# Patient Record
Sex: Male | Born: 1950 | Race: White | Hispanic: No | Marital: Married
Health system: Southern US, Community
[De-identification: ages and names within clinical notes are randomized; demographics above are authoritative.]

## PROBLEM LIST (undated history)

## (undated) DIAGNOSIS — G20 Parkinson's disease: Secondary | ICD-10-CM

## (undated) DIAGNOSIS — E119 Type 2 diabetes mellitus without complications: Secondary | ICD-10-CM

---

## 2020-10-31 ENCOUNTER — Emergency Department (HOSPITAL_COMMUNITY): Payer: Medicare Other

## 2020-10-31 ENCOUNTER — Emergency Department (HOSPITAL_COMMUNITY)
Admission: EM | Admit: 2020-10-31 | Discharge: 2020-10-31 | Disposition: A | Discharge status: Home / Self Care | Payer: Medicare Other | Attending: Emergency Medicine | Admitting: Emergency Medicine

## 2020-10-31 ENCOUNTER — Encounter (HOSPITAL_COMMUNITY): Payer: Self-pay

## 2020-10-31 ENCOUNTER — Other Ambulatory Visit (HOSPITAL_COMMUNITY): Payer: Medicare Other

## 2020-10-31 DIAGNOSIS — Y9301 Activity, walking, marching and hiking: Secondary | ICD-10-CM | POA: Diagnosis not present

## 2020-10-31 DIAGNOSIS — S065X9A Traumatic subdural hemorrhage with loss of consciousness of unspecified duration, initial encounter: Secondary | ICD-10-CM

## 2020-10-31 DIAGNOSIS — Z7901 Long term (current) use of anticoagulants: Secondary | ICD-10-CM | POA: Insufficient documentation

## 2020-10-31 DIAGNOSIS — G44319 Acute post-traumatic headache, not intractable: Secondary | ICD-10-CM | POA: Diagnosis not present

## 2020-10-31 DIAGNOSIS — M25511 Pain in right shoulder: Secondary | ICD-10-CM | POA: Insufficient documentation

## 2020-10-31 DIAGNOSIS — S065X0A Traumatic subdural hemorrhage without loss of consciousness, initial encounter: Secondary | ICD-10-CM | POA: Diagnosis not present

## 2020-10-31 DIAGNOSIS — M25552 Pain in left hip: Secondary | ICD-10-CM | POA: Diagnosis not present

## 2020-10-31 DIAGNOSIS — W19XXXA Unspecified fall, initial encounter: Secondary | ICD-10-CM

## 2020-10-31 DIAGNOSIS — M545 Low back pain, unspecified: Secondary | ICD-10-CM | POA: Insufficient documentation

## 2020-10-31 DIAGNOSIS — W108XXA Fall (on) (from) other stairs and steps, initial encounter: Secondary | ICD-10-CM | POA: Diagnosis not present

## 2020-10-31 DIAGNOSIS — M25512 Pain in left shoulder: Secondary | ICD-10-CM | POA: Diagnosis not present

## 2020-10-31 DIAGNOSIS — M79652 Pain in left thigh: Secondary | ICD-10-CM | POA: Insufficient documentation

## 2020-10-31 DIAGNOSIS — S0992XA Unspecified injury of nose, initial encounter: Secondary | ICD-10-CM | POA: Diagnosis present

## 2020-10-31 DIAGNOSIS — S0031XA Abrasion of nose, initial encounter: Secondary | ICD-10-CM | POA: Insufficient documentation

## 2020-10-31 DIAGNOSIS — S065XAA Traumatic subdural hemorrhage with loss of consciousness status unknown, initial encounter: Secondary | ICD-10-CM

## 2020-10-31 HISTORY — DX: Type 2 diabetes mellitus without complications: E11.9

## 2020-10-31 HISTORY — DX: Parkinson's disease: G20

## 2020-10-31 LAB — COMPREHENSIVE METABOLIC PANEL
ALT: 32 U/L (ref 0–44)
AST: 65 U/L — ABNORMAL HIGH (ref 15–41)
Albumin: 3.2 g/dL — ABNORMAL LOW (ref 3.5–5.0)
Alkaline Phosphatase: 148 U/L — ABNORMAL HIGH (ref 38–126)
Anion gap: 10 (ref 5–15)
BUN: 13 mg/dL (ref 8–23)
CO2: 25 mmol/L (ref 22–32)
Calcium: 9.1 mg/dL (ref 8.9–10.3)
Chloride: 102 mmol/L (ref 98–111)
Creatinine, Ser: 1.18 mg/dL (ref 0.61–1.24)
GFR, Estimated: >60 mL/min (ref >60)
Glucose, Bld: 79 mg/dL (ref 70–99)
Potassium: 3.7 mmol/L (ref 3.5–5.1)
Sodium: 137 mmol/L (ref 135–145)
Total Bilirubin: 0.8 mg/dL (ref 0.3–1.2)
Total Protein: 7.9 g/dL (ref 6.5–8.1)

## 2020-10-31 LAB — CBC WITH DIFFERENTIAL/PLATELET
Abs Immature Granulocytes: 0.02 10*3/uL (ref 0.00–0.07)
Basophils Absolute: 0 10*3/uL (ref 0.0–0.1)
Basophils Relative: 1 %
Eosinophils Absolute: 0.1 10*3/uL (ref 0.0–0.5)
Eosinophils Relative: 2 %
HCT: 42.7 % (ref 39.0–52.0)
Hemoglobin: 13.8 g/dL (ref 13.0–17.0)
Immature Granulocytes: 0 %
Lymphocytes Relative: 27 %
Lymphs Abs: 1.4 10*3/uL (ref 0.7–4.0)
MCH: 28.1 pg (ref 26.0–34.0)
MCHC: 32.3 g/dL (ref 30.0–36.0)
MCV: 87 fL (ref 80.0–100.0)
Monocytes Absolute: 0.4 10*3/uL (ref 0.1–1.0)
Monocytes Relative: 7 %
Neutro Abs: 3.2 10*3/uL (ref 1.7–7.7)
Neutrophils Relative %: 63 %
Platelets: 253 10*3/uL (ref 150–400)
RBC: 4.91 MIL/uL (ref 4.22–5.81)
RDW: 13.6 % (ref 11.5–15.5)
WBC: 5.2 10*3/uL (ref 4.0–10.5)
nRBC: 0 % (ref 0.0–0.2)

## 2020-10-31 LAB — PROTIME-INR
INR: 1.2 (ref 0.8–1.2)
Prothrombin Time: 15.1 seconds (ref 11.4–15.2)

## 2020-10-31 MED ORDER — CYCLOBENZAPRINE HCL 10 MG PO TABS
5.0000 mg | ORAL_TABLET | Freq: Once | ORAL | Status: AC
  Administered 2020-10-31: 5 mg via ORAL
  Filled 2020-10-31: qty 1

## 2020-10-31 MED ORDER — SODIUM CHLORIDE 0.9% FLUSH
3.0000 mL | INTRAVENOUS | Status: DC | PRN
  Administered 2020-10-31: 3 mL via INTRAVENOUS

## 2020-10-31 MED ORDER — ACETAMINOPHEN 500 MG PO TABS
1000.0000 mg | ORAL_TABLET | Freq: Once | ORAL | Status: AC
  Administered 2020-10-31: 1000 mg via ORAL
  Filled 2020-10-31: qty 2

## 2020-10-31 MED ORDER — FENTANYL CITRATE (PF) 100 MCG/2ML IJ SOLN
50.0000 ug | Freq: Once | INTRAMUSCULAR | Status: AC
  Administered 2020-10-31: 50 ug via INTRAVENOUS
  Filled 2020-10-31: qty 2

## 2020-10-31 MED ORDER — METOCLOPRAMIDE HCL 5 MG/ML IJ SOLN
5.0000 mg | Freq: Once | INTRAMUSCULAR | Status: AC
  Administered 2020-10-31: 5 mg via INTRAVENOUS
  Filled 2020-10-31: qty 2

## 2020-10-31 MED ORDER — ONDANSETRON HCL 4 MG/2ML IJ SOLN: 4.0000 mg | Freq: Once | INTRAMUSCULAR | Status: DC

## 2020-10-31 MED ORDER — SODIUM CHLORIDE 0.9 % IV SOLN: 250.0000 mL | INTRAVENOUS | Status: DC | PRN

## 2020-10-31 MED ORDER — MORPHINE SULFATE (PF) 4 MG/ML IV SOLN: 4.0000 mg | Freq: Once | INTRAVENOUS | Status: DC

## 2020-10-31 MED ORDER — MORPHINE SULFATE (PF) 2 MG/ML IV SOLN
2.0000 mg | Freq: Once | INTRAVENOUS | Status: AC
Start: 2020-10-31 — End: 2020-10-31
  Administered 2020-10-31: 2 mg via INTRAVENOUS
  Filled 2020-10-31: qty 1

## 2020-10-31 MED ORDER — ONDANSETRON HCL 4 MG/2ML IJ SOLN
4.0000 mg | Freq: Once | INTRAMUSCULAR | Status: AC
  Administered 2020-10-31: 4 mg via INTRAVENOUS
  Filled 2020-10-31: qty 2

## 2020-10-31 MED ORDER — SODIUM CHLORIDE 0.9% FLUSH
3.0000 mL | Freq: Two times a day (BID) | INTRAVENOUS | Status: DC
  Administered 2020-10-31 (×2): 3 mL via INTRAVENOUS

## 2020-10-31 NOTE — ED Notes (Signed)
Pt back from ct, provided urinal at this time, family at bedside.

## 2020-10-31 NOTE — ED Notes (Addendum)
Pt explained plan to walk after pain medication, pt and family expressed concern that he is too weak to walk stating they would prefer to stay overnight and be admitted.

## 2020-10-31 NOTE — ED Provider Notes (Signed)
Physical Exam  BP 124/85   Pulse (!) 110   Temp (!) 97.4 F (36.3 C) (Oral)   Resp 17   Ht 5\' 10"  (1.778 m)   Wt 84.8 kg   SpO2 93%   BMI 26.83 kg/m   Physical Exam Vitals and nursing note reviewed.  Constitutional:      General: He is awake. He is not in acute distress.    Appearance: Normal appearance. He is well-developed and well-groomed. He is not ill-appearing.  HENT:     Head: Normocephalic.     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      Nose: No nasal deformity.     Right Nostril: No epistaxis.     Left Nostril: No epistaxis.     Comments: Abrasion across nose Eyes:     General: No visual field deficit or scleral icterus.       Right eye: No discharge.        Left eye: No discharge.     Conjunctiva/sclera: Conjunctivae normal.  Cardiovascular:     Rate and Rhythm: Normal rate and regular rhythm.  Pulmonary:     Effort: Pulmonary effort is normal. No respiratory distress.  Musculoskeletal:        General: No signs of injury.  Skin:    General: Skin is warm and dry.     Findings: No rash.  Neurological:     General: No focal deficit present.     Mental Status: He is alert and oriented to person, place, and time.     GCS: GCS eye subscore is 4. GCS verbal subscore is 5. GCS motor subscore is 6.     Cranial Nerves: Cranial nerves are intact. No cranial nerve deficit, dysarthria or facial asymmetry.     Sensory: Sensation is intact. No sensory deficit.     Motor: Motor function is intact. No weakness.     Gait: Gait is intact. Gait normal.  Psychiatric:        Mood and Affect: Mood normal.        Behavior: Behavior normal. Behavior is cooperative.    ED Course/Procedures   Clinical Course as of 10/31/20 2250  Fri Oct 31, 2020  1325 DG Pelvis Portable negative [CG]  1325 DG Chest Port 1 View Non acute  [CG]  1326 EKG 12-Lead Sinus or ectopic atrial tachycardia HR 109 Left anterior  fascicular block Borderline low voltage, extremity leads Abnormal R-wave progression, late transition Minimal ST depression, lateral leads Prolonged QT interval 528 Confirmed by Nov 02, 2020 [phone]) on 10/31/2020 12:45:48 PM [CG]  1410 DG Hand Complete Right No evidence of acute fracture. No dislocation. Moderate osteoarthritis throughout thE hand as manifested by joint space narrowing and marginal osteophyte formation. Mild soft tissue swelling over the dorsum of the hand at the level of the MCP joints. [CG]  1504 Wife at bedside.  Provides additional history.  States patient got out of the car and had walked up 4 steps and fell backwards, hit his head somehow landed underneath her car causing abrasion on the nose.  Most of his injuries were to the back of his head and back. [CG]  1519 3 mm left subdural hematoma faux cerebri left subgaleal hematoma  Deviation and age undetermined nasal fracture  C-spine DDD [CG]  1528 IMPRESSION: 1. A 3 mm left parafalcine subdural hematoma with no associated midline shift. 2. Leftward deviation of the nasal septum with an  age-indeterminate fracture of the nasal septum otherwise no acute displaced fracture. 3. No acute displaced fracture or traumatic listhesis of the cervical spine. [CG]  1557 Spoke to Dr Dutch Quint - recommendations Repeat CT scan in 6 hours No need for Eliquis reversal. Hold Eliquis 72 hours No BP control recommendations, not super high Follow up with PCP Will not see patient in ER but will write a note [CG]    Clinical Course User Index [CG] Liberty Handy, PA-C    Procedures None  MDM  Assumed care from Sharen Heck PA-C at 1600. Please refer to her note for full H&P and initial MDM. Briefly, 69yoM presents to the ED for mechanical GLF on Eliquis and found to have 52mm SDH. Plan at time of sign out will be to obtain repeat head CT in 6 hours.  Repeat head CT stable. Patient ambulating in ED without difficulty. CT lumbar spine  obtained for reports of low back pain, which was unremarkable for acute traumatic injury. Per Neurosurgery recommendations, plan will be to discharge home with PCP follow up. Hold Eliquis for the next 72 hours. On reassessment, patient resting comfortably, GCS 15, and in no distress. He remained HDS and appropriate for discharge.  Strict return precautions provided and discussed. Questions and concerns addressed. Patient and spouse verbalized understanding and amenable with discharge plan. Discharged in stable condition.   Tonia Brooms, MD 10/31/20 2250    Melene Plan, DO 10/31/20 2302

## 2020-10-31 NOTE — ED Notes (Addendum)
Pt ambulated good with two person assist

## 2020-10-31 NOTE — ED Notes (Signed)
Patient transported to CT 

## 2020-10-31 NOTE — ED Notes (Signed)
Patient transported to CT via stretcher in stable condition 

## 2020-10-31 NOTE — Discharge Instructions (Signed)
You were seen in the emergency department after a fall  CT scan showed a small 3 mm subdural hematoma  Repeat scans did not show any worsening of the bleeding  Neurosurgery deemed you appropriate for discharge  Take 650 to 1000 mg of acetaminophen every 6-8 hours for headache.  Do not take your Eliquis for the next 72 hours.  Call your primary care doctor and make an appointment in 72 hours for repeat evaluation.  Return to the ED immediately for sudden or severe headache, seizures, nausea, vomiting, any stroke symptoms

## 2020-10-31 NOTE — ED Provider Notes (Addendum)
[facility] EMERGENCY DEPARTMENT Provider Note   CSN: [id number] Arrival date & time: 10/31/20  1226     History Chief Complaint  Patient presents with  . Fall    Micheal Park is a 70 y.o. male presents to the ED by EMS for evaluation of fall that occurred prior to arrival.  Per EMS patient was found on his back awake.  They sat him up and he reported feeling dizzy, nausea and vomited once.  History obtained directly from patient who is fully oriented to self, year and place.  States he was walking down some steps and tried to grab the railing and missed and he fell backwards onto his back.  Reports headache, left hip pain and bilateral shoulder pain.  Denies any vision changes, neck pain, chest pain, shortness of breath, abdominal pain, new numbness or tingling or weakness of his extremities.  He is on blood thinners for "something for my heart".  Denies preceding lightheadedness, near syncope.  No reported LOC.  HPI     No past medical history on file.  There are no problems to display for this patient.   ** The histories are not reviewed yet. Please review them in the "History" navigator section and refresh this SmartLink.     No family history on file.     Home Medications Prior to Admission medications   Not on File    Allergies    Patient has no known allergies.  Review of Systems   Review of Systems  Gastrointestinal: Positive for nausea and vomiting.  Musculoskeletal: Positive for arthralgias.  Neurological: Positive for headaches.  Hematological: Bruises/bleeds easily.  All other systems reviewed and are negative.   Physical Exam Updated Vital Signs BP 127/85 (BP Location: Right Arm)   Pulse (!) 108   Temp (!) 97.4 F (36.3 C) (Oral)   Resp 19   Ht  (1.778 m)   Wt 84.8 kg   SpO2 96%   BMI 26.83 kg/m   Physical Exam Constitutional:      General: He is not in acute distress.    Appearance: He is well-developed.  HENT:      Head:     Comments: Small linear abrasion abrasion bridge of nose.  Mild posterior scalp tenderness, no open wounds.  Large open wound occipital scalp size of a dollar coin with dressing.  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      Ears:     Comments: No battle's sign     Nose:     Comments: No intranasal bleeding or rhinorrhea. Septum midline    Mouth/Throat:     Comments: No intraoral bleeding or injury. No malocclusion. MMM. Dentition appears stable.  Eyes:     Conjunctiva/sclera: Conjunctivae normal.     Comments: Lids normal. EOMs and PERRL intact. No racoon's eyes   Neck:     Comments: C-spine: No cervical collar in place. No midline or paraspinal muscular tenderness. Full active ROM of cervical spine w/o pain. Trachea midline Cardiovascular:     Rate and Rhythm: Normal rate and regular rhythm.     Pulses:          Radial pulses are 1+ on the right side and 1+ on the left side.       Dorsalis pedis pulses are 1+ on the right side and 1+ on the left side.     Heart sounds: Normal  heart sounds, S1 normal and S2 normal.  Pulmonary:     Effort: Pulmonary effort is normal.     Breath sounds: Normal breath sounds. No decreased breath sounds.  Abdominal:     Palpations: Abdomen is soft.     Tenderness: There is no abdominal tenderness.     Comments: No guarding. No seatbelt sign.   Musculoskeletal:        General: Normal range of motion.     Lumbar back: Tenderness present.     Left hip: Deformity present.     Comments: T-spine: no paraspinal muscular tenderness or midline tenderness.   L-spine: midline tenderness.  no paraspinal muscular tenderness. Left buttock tenderness. Pelvis: left leg external rotated and longer than left.  able to lift leg and hold off bed. Mild pain with deep flexion at hip, IR/ER.  No instability with AP/L compression, leg shortening or rotation.   Skin:    General: Skin is  warm and dry.     Capillary Refill: Capillary refill takes less than 2 seconds.  Neurological:     Mental Status: He is alert, oriented to person, place, and time and easily aroused.     Comments:  Flat facies.  Speech is fluent without obvious dysarthria or dysphasia. Strength 5/5 with hand grip and ankle F/E.   Sensation to light touch intact in hands and feet.  CN II-XII grossly intact bilaterally.   Psychiatric:        Behavior: Behavior normal. Behavior is cooperative.        Thought Content: Thought content normal.     ED Results / Procedures / Treatments   Labs (all labs ordered are listed, but only abnormal results are displayed) Labs Reviewed  CBC WITH DIFFERENTIAL/PLATELET  PROTIME-INR  CBC WITH DIFFERENTIAL/PLATELET  COMPREHENSIVE METABOLIC PANEL  I-STAT CHEM 8, ED    EKG EKG Interpretation  Date/Time:  Friday October 31 2020 12:38:08 EST Ventricular Rate:  109 PR Interval:    QRS Duration: 96 QT Interval:  392 QTC Calculation: 528 R Axis:   -52 Text Interpretation: Sinus or ectopic atrial tachycardia Left anterior fascicular block Borderline low voltage, extremity leads Abnormal R-wave progression, late transition Minimal ST depression, lateral leads Prolonged QT interval Confirmed by Kristine Royal [phone]) on 10/31/2020 12:45:48 PM   Radiology DG Lumbar Spine Complete  Result Date: 10/31/2020 CLINICAL DATA:  Midline tenderness after fall EXAM: LUMBAR SPINE - COMPLETE 4+ VIEW COMPARISON:  None. FINDINGS: Lumbar alignment within normal limits. Posterior rods and fixating screws with interbody device at L5-S1. Vertebral body heights are maintained. Mild degenerative changes at L4-L5. Aortic atherosclerosis. IMPRESSION: Postsurgical changes at L5-S1. No acute osseous abnormality. Electronically Signed   By: Jasmine Pang M.D.   On: 10/31/2020 15:35   CT HEAD WO CONTRAST  Result Date: 10/31/2020 CLINICAL DATA:  Status post fall, nasal abrasion. EXAM: CT HEAD WITHOUT  CONTRAST CT MAXILLOFACIAL WITHOUT CONTRAST CT CERVICAL SPINE WITHOUT CONTRAST TECHNIQUE: Multidetector CT imaging of the head, cervical spine, and maxillofacial structures were performed using the standard protocol without intravenous contrast. Multiplanar CT image reconstructions of the cervical spine and maxillofacial structures were also generated. COMPARISON:  None. FINDINGS: CT HEAD FINDINGS Brain: Patchy and confluent areas of decreased attenuation are noted throughout the deep and periventricular white matter of the cerebral hemispheres bilaterally, compatible with chronic microvascular ischemic disease. No evidence of large-territorial acute infarction. No parenchymal hemorrhage. No mass lesion. Thickening of the left aspect of the falx cerebri with suggestion of a 3  mm subdural parafalcine hematoma. No mass effect or midline shift. No hydrocephalus. Basilar cisterns are patent. Vascular: No hyperdense vessel. Atherosclerotic calcifications are present within the cavernous internal carotid and vertebral arteries. Skull: No acute fracture or focal lesion. Other: There is a 5 mm left parietal subgaleal hematoma. CT MAXILLOFACIAL FINDINGS Osseous: Leftward deviation of the nasal septum with an age-indeterminate fracture. No definite acute nasal bone or process of the maxilla nasal process fracture. No acute displaced fracture or mandibular dislocation. No destructive process. Sinuses/Orbits: Mucosal thickening of the right maxillary sinus. Otherwise no air-fluid levels. The remaining visualized paranasal sinuses and mastoid air cells are clear. The orbits are unremarkable. Soft tissues: Trace subcutaneus soft tissue edema. CT CERVICAL SPINE FINDINGS Alignment: Normal. Skull base and vertebrae: Multilevel at least moderate degenerative changes of the spine with associated severe intervertebral disc space narrowing at the C5-C6 level. No severe osseous neural foraminal or central canal stenosis. Punctate density  anterior to the vomer may represent a tiny avulsion fracture. No acute fracture. No aggressive appearing focal osseous lesion or focal pathologic process. Soft tissues and spinal canal: No prevertebral fluid or swelling. No visible canal hematoma. Upper chest: Unremarkable. Other: At least moderate calcifications of the carotid arteries within the neck. IMPRESSION: 1. A 3 mm left parafalcine subdural hematoma with no associated midline shift. 2. Leftward deviation of the nasal septum with an age-indeterminate fracture of the nasal septum otherwise no acute displaced fracture. 3. No acute displaced fracture or traumatic listhesis of the cervical spine. These results were called by telephone at the time of interpretation on 10/31/2020 at 3:18 pm to provider Fay Swider , who verbally acknowledged these results. Electronically Signed   By: Tish Frederickson M.D.   On: 10/31/2020 15:21   CT CERVICAL SPINE WO CONTRAST  Result Date: 10/31/2020 CLINICAL DATA:  Status post fall, nasal abrasion. EXAM: CT HEAD WITHOUT CONTRAST CT MAXILLOFACIAL WITHOUT CONTRAST CT CERVICAL SPINE WITHOUT CONTRAST TECHNIQUE: Multidetector CT imaging of the head, cervical spine, and maxillofacial structures were performed using the standard protocol without intravenous contrast. Multiplanar CT image reconstructions of the cervical spine and maxillofacial structures were also generated. COMPARISON:  None. FINDINGS: CT HEAD FINDINGS Brain: Patchy and confluent areas of decreased attenuation are noted throughout the deep and periventricular white matter of the cerebral hemispheres bilaterally, compatible with chronic microvascular ischemic disease. No evidence of large-territorial acute infarction. No parenchymal hemorrhage. No mass lesion. Thickening of the left aspect of the falx cerebri with suggestion of a 3 mm subdural parafalcine hematoma. No mass effect or midline shift. No hydrocephalus. Basilar cisterns are patent. Vascular: No  hyperdense vessel. Atherosclerotic calcifications are present within the cavernous internal carotid and vertebral arteries. Skull: No acute fracture or focal lesion. Other: There is a 5 mm left parietal subgaleal hematoma. CT MAXILLOFACIAL FINDINGS Osseous: Leftward deviation of the nasal septum with an age-indeterminate fracture. No definite acute nasal bone or process of the maxilla nasal process fracture. No acute displaced fracture or mandibular dislocation. No destructive process. Sinuses/Orbits: Mucosal thickening of the right maxillary sinus. Otherwise no air-fluid levels. The remaining visualized paranasal sinuses and mastoid air cells are clear. The orbits are unremarkable. Soft tissues: Trace subcutaneus soft tissue edema. CT CERVICAL SPINE FINDINGS Alignment: Normal. Skull base and vertebrae: Multilevel at least moderate degenerative changes of the spine with associated severe intervertebral disc space narrowing at the C5-C6 level. No severe osseous neural foraminal or central canal stenosis. Punctate density anterior to the vomer may represent a tiny avulsion  fracture. No acute fracture. No aggressive appearing focal osseous lesion or focal pathologic process. Soft tissues and spinal canal: No prevertebral fluid or swelling. No visible canal hematoma. Upper chest: Unremarkable. Other: At least moderate calcifications of the carotid arteries within the neck. IMPRESSION: 1. A 3 mm left parafalcine subdural hematoma with no associated midline shift. 2. Leftward deviation of the nasal septum with an age-indeterminate fracture of the nasal septum otherwise no acute displaced fracture. 3. No acute displaced fracture or traumatic listhesis of the cervical spine. These results were called by telephone at the time of interpretation on 10/31/2020 at 3:18 pm to provider Ovidio Steele , who verbally acknowledged these results. Electronically Signed   By: Tish FredericksonMorgane  Naveau M.D.   On: 10/31/2020 15:21   DG Pelvis  Portable  Result Date: 10/31/2020 CLINICAL DATA:  Fall. EXAM: PORTABLE PELVIS 1-2 VIEWS COMPARISON:  None. FINDINGS: There is no evidence of pelvic fracture or diastasis. No pelvic bone lesions are seen. IMPRESSION: Negative. Electronically Signed   By: Lupita RaiderJames  Green Jr M.D.   On: 10/31/2020 13:20   DG Chest Port 1 View  Result Date: 10/31/2020 CLINICAL DATA:  Fall. EXAM: PORTABLE CHEST 1 VIEW COMPARISON:  None. FINDINGS: Mild cardiomegaly is noted. Sternotomy wires are noted. No pneumothorax or pleural effusion is noted. Both lungs are clear. The visualized skeletal structures are unremarkable. IMPRESSION: No active disease. Electronically Signed   By: Lupita RaiderJames  Green Jr M.D.   On: 10/31/2020 13:19   DG Hand Complete Right  Result Date: 10/31/2020 CLINICAL DATA:  Right hand pain, abrasion after fall EXAM: RIGHT HAND - COMPLETE 3+ VIEW COMPARISON:  None. FINDINGS: No evidence of acute fracture. No dislocation. Moderate osteoarthritis throughout the hand as manifested by joint space narrowing and marginal osteophyte formation. Mild soft tissue swelling over the dorsum of the hand at the level of the MCP joints. IMPRESSION: 1. No acute fracture or dislocation. 2. 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 Electronically Signed   By: Duanne GuessNicholas  Plundo D.O.   On: 10/31/2020 13:55   CT MAXILLOFACIAL WO CONTRAST  Result Date: 10/31/2020 CLINICAL DATA:  Status post fall, nasal abrasion. EXAM: CT HEAD WITHOUT CONTRAST CT MAXILLOFACIAL WITHOUT CONTRAST CT CERVICAL SPINE WITHOUT CONTRAST TECHNIQUE: Multidetector CT imaging of the head, cervical spine, and maxillofacial structures were performed using the standard protocol without intravenous contrast. Multiplanar CT image reconstructions of the cervical spine and maxillofacial structures were also generated. COMPARISON:  None. FINDINGS: CT HEAD FINDINGS Brain: Patchy and confluent areas of decreased attenuation are noted throughout the deep and periventricular white  matter of the cerebral hemispheres bilaterally, compatible with chronic microvascular ischemic disease. No evidence of large-territorial acute infarction. No parenchymal hemorrhage. No mass lesion. Thickening of the left aspect of the falx cerebri with suggestion of a 3 mm subdural parafalcine hematoma. No mass effect or midline shift. No hydrocephalus. Basilar cisterns are patent. Vascular: No hyperdense vessel. Atherosclerotic calcifications are present within the cavernous internal carotid and vertebral arteries. Skull: No acute fracture or focal lesion. Other: There is a 5 mm left parietal subgaleal hematoma. CT MAXILLOFACIAL FINDINGS Osseous: Leftward deviation of the nasal septum with an age-indeterminate fracture. No definite acute nasal bone or process of the maxilla nasal process fracture. No acute displaced fracture or mandibular dislocation. No destructive process. Sinuses/Orbits: Mucosal thickening of the right maxillary sinus. Otherwise no air-fluid levels. The remaining visualized paranasal sinuses and mastoid air cells are clear. The orbits are unremarkable. Soft tissues: Trace subcutaneus soft tissue edema. CT  CERVICAL SPINE FINDINGS Alignment: Normal. Skull base and vertebrae: Multilevel at least moderate degenerative changes of the spine with associated severe intervertebral disc space narrowing at the C5-C6 level. No severe osseous neural foraminal or central canal stenosis. Punctate density anterior to the vomer may represent a tiny avulsion fracture. No acute fracture. No aggressive appearing focal osseous lesion or focal pathologic process. Soft tissues and spinal canal: No prevertebral fluid or swelling. No visible canal hematoma. Upper chest: Unremarkable. Other: At least moderate calcifications of the carotid arteries within the neck. IMPRESSION: 1. A 3 mm left parafalcine subdural hematoma with no associated midline shift. 2. Leftward deviation of the nasal septum with an age-indeterminate  fracture of the nasal septum otherwise no acute displaced fracture. 3. No acute displaced fracture or traumatic listhesis of the cervical spine. These results were called by telephone at the time of interpretation on 10/31/2020 at 3:18 pm to provider Kainoa Swoboda , who verbally acknowledged these results. Electronically Signed   By: Tish Frederickson M.D.   On: 10/31/2020 15:21    Procedures .Critical Care Performed by: Liberty Handy, PA-C Authorized by: Liberty Handy, PA-C   Critical care provider statement:    Critical care time (minutes):  45   Critical care was necessary to treat or prevent imminent or life-threatening deterioration of the following conditions: subdural hematoma.   Critical care was time spent personally by me on the following activities:  Discussions with consultants, evaluation of patient's response to treatment, examination of patient, ordering and performing treatments and interventions, ordering and review of laboratory studies, ordering and review of radiographic studies, pulse oximetry, re-evaluation of patient's condition, obtaining history from patient or surrogate, review of old charts and development of treatment plan with patient or surrogate   I assumed direction of critical care for this patient from another provider in my specialty: no       Medications Ordered in ED Medications  sodium chloride flush (NS) 0.9 % injection 3 mL (3 mLs Intravenous Given 10/31/20 1253)  sodium chloride flush (NS) 0.9 % injection 3 mL (3 mLs Intravenous Given 10/31/20 1253)  0.9 %  sodium chloride infusion (has no administration in time range)  fentaNYL (SUBLIMAZE) injection 50 mcg (50 mcg Intravenous Given 10/31/20 1252)  ondansetron (ZOFRAN) injection 4 mg (4 mg Intravenous Given 10/31/20 1252)  morphine 2 MG/ML injection 2 mg (2 mg Intravenous Given 10/31/20 1536)  metoCLOPramide (REGLAN) injection 5 mg (5 mg Intravenous Given 10/31/20 1541)    ED Course  I have reviewed  the triage vital signs and the nursing notes.  Pertinent labs & imaging results that were available during my care of the patient were reviewed by me and considered in my medical decision making (see chart for details).  Clinical Course as of 10/31/20 1603  Fri Oct 31, 2020  1325 DG Pelvis Portable negative [CG]  1325 DG Chest Port 1 View Non acute  [CG]  1326 EKG 12-Lead Sinus or ectopic atrial tachycardia HR 109 Left anterior fascicular block Borderline low voltage, extremity leads Abnormal R-wave progression, late transition Minimal ST depression, lateral leads Prolonged QT interval 528 Confirmed by Kristine Royal [phone]) on 10/31/2020 12:45:48 PM [CG]  1410 DG Hand Complete Right No evidence of acute fracture. No dislocation. Moderate osteoarthritis throughout thE hand as manifested by joint space narrowing and marginal osteophyte formation. Mild soft tissue swelling over the dorsum of the hand at the level of the MCP joints. [CG]  1504 Wife at bedside.  Provides  additional history.  States patient got out of the car and had walked up 4 steps and fell backwards, hit his head somehow landed underneath her car causing abrasion on the nose.  Most of his injuries were to the back of his head and back. [CG]  1519 3 mm left subdural hematoma faux cerebri left subgaleal hematoma  Deviation and age undetermined nasal fracture  C-spine DDD [CG]  1528 IMPRESSION: 1. A 3 mm left parafalcine subdural hematoma with no associated midline shift. 2. Leftward deviation of the nasal septum with an age-indeterminate fracture of the nasal septum otherwise no acute displaced fracture. 3. No acute displaced fracture or traumatic listhesis of the cervical spine. [CG]  1557 Spoke to Dr Dutch Quint - recommendations Repeat CT scan in 6 hours No need for Eliquis reversal. Hold Eliquis 72 hours No BP control recommendations, not super high Follow up with PCP Will not see patient in ER but will write a note [CG]     Clinical Course User Index [CG] Jerrell Mylar   MDM Rules/Calculators/A&P                          70 year old male presents for evaluation after what he describes a mechanical fall.  Posterior head, lumbar spine and left buttock tenderness. Small abrasion on the nose.  Reported positional lightheadedness, nausea and vomiting after patient sat up after falling. On Eliquis, metoprolol and amiodarone for atrial fibrillation.   EMR, triage nursing notes reviewed to assist with history and MDM  Additional information obtained directly from EMS and wife at bedside   ER labs, imaging ordered by me  ER work-up including labs, imaging personally visualized and interpreted  Labs reveal - CBC unremarkable. Resent CMP.   Imaging reveals - right hand, CXR and pelvis x-rays unremarkable. Pending CT head, c-spine, maxillofacial, lumbar x-ray.   Medicines ordered - fentanyl, zofran, morphine  1535: CT head shows 3 mm left parafalcine subdural hematoma, no shift. Patient re-evaluated. Reports headache, will give morphine/reglan. Neurosurgery paged.   1600:  Spoke to Dr Dutch Quint.  See recommendations above. Patient to be observed in ER and get repeat CT scan in 6 hours. If unchanged discharge with PCP follow up. Hold Eliquis for 72 hours. Patient care transferred to oncoming EDP at shift change who will repeat CT and determine disposition. Recommend ambulating patient. Consider CT if persistent left low back/hip pain for occult fracture.  Final Clinical Impression(s) / ED Diagnoses Final diagnoses:  Fall  Acute midline low back pain without sciatica  Acute post-traumatic headache, not intractable  Subdural hematoma [facility])    Rx / DC Orders ED Discharge Orders    None       Liberty Handy, PA-C 10/31/20 1603    Wynetta Fines, MD 11/04/20 1452

## 2020-10-31 NOTE — ED Notes (Signed)
Pt able to stand/bare weight on legs but is unable to take more than one step. Pt states he is in severe pain and unable to move. Reporting hip pain.

## 2020-10-31 NOTE — ED Notes (Signed)
Wife at bedside. Wife took possession of pts knife.

## 2020-10-31 NOTE — Progress Notes (Signed)
Orthopedic Tech Progress Note Patient Details:  Micheal Park 04/13/1951 [id number] Level 2 trauma Patient ID: [name], male   DOB: June 14, 1951, 70 y.o.   MRN: [id number]   [name] 10/31/2020, 12:41 PM

## 2020-10-31 NOTE — ED Notes (Signed)
Pt inconsistent with c/o neck pain. Initially denied any neck pain. When asked on secondary assessment pt stated "hurts a little."  Pt now denies any cervical pain.

## 2020-10-31 NOTE — Progress Notes (Signed)
70 year old male status post mechanical fall.  Unknown loss of consciousness.  No neurologic deficits by report.  Hemodynamically stable.  History of Parkinson's disease with some degree of gait instability.  Head CT scan demonstrates minimal parafalcine hemorrhage which may or may not represent normal venous congestion verses true subdural hematoma.  No evidence of cortical contusion or traumatic subarachnoid blood.   his situation is complicated by Eliquis use his anticoagulant.  With the presence of anticoagulation on board I would recommend observation in the emergency department for another 6 hours with a repeat head CT scan.  If CT scan is stable that I think he may be safely discharged home.  I recommend that he hold his Eliquis for 72 hours and then restart with his normal dose.  He should follow-up with his primary care doctor blems.should he have any additional   Problems.

## 2020-10-31 NOTE — ED Triage Notes (Signed)
[facility] s/p fall X4 ft backwards. No LOC, Headache to frontal lobe small  abrasion to bridge of nose, old wound quarter sized to back of head. Bilat shoulder pain, left hip pain, left leg externally rotated per PA. Small lac to 1st knuckle right hand. Abrasions to thoracic back. 1st unit on scene sat pt up  when pt became nauseated, dizzy and vomited.

## 2020-10-31 NOTE — ED Notes (Signed)
X-ray at bedside

## 2021-12-22 IMAGING — CT CT HEAD W/O CM
4 series · 17 of 47 positions shown, 19 images · non-contrast
Comparison: 10/31/2020 at [DATE] p.m.

CLINICAL DATA: Fell, left para falcine subdural hematoma

EXAM:
CT HEAD WITHOUT CONTRAST
TECHNIQUE: Contiguous axial images were obtained from the base of the skull
through the vertex without intravenous contrast.

[Series 3: head wo · axial · 0.42mm/px · z∈[+1212,+1342]mm · 7 of 36 slices shown, 9 images]
[im 5/36  brain]
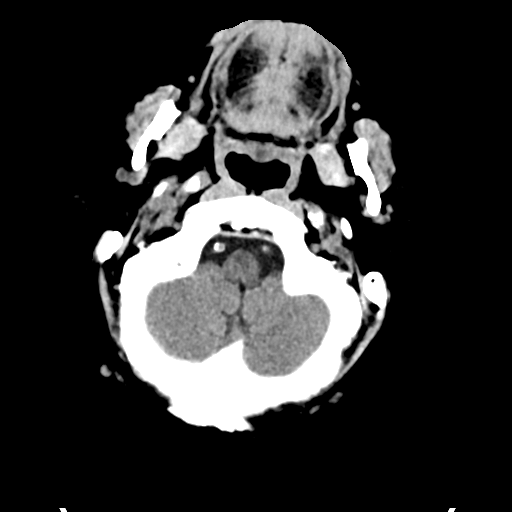
[im 5/36  bone]
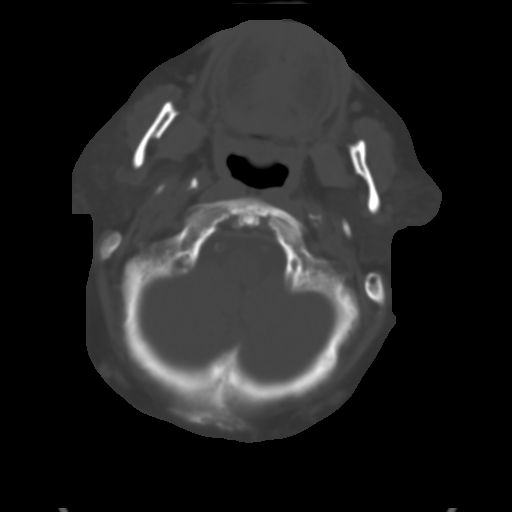
[im 9/36  brain]
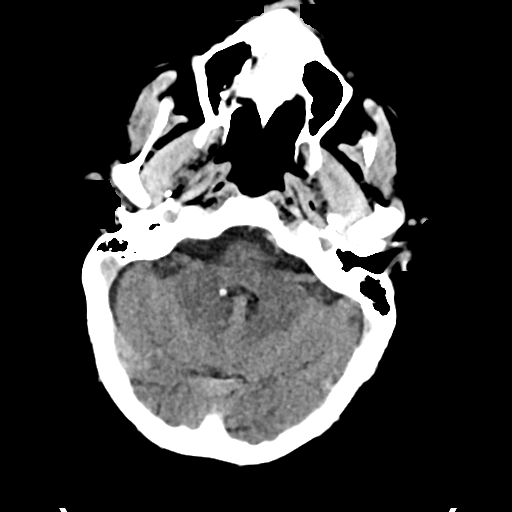
[im 14/36  brain]
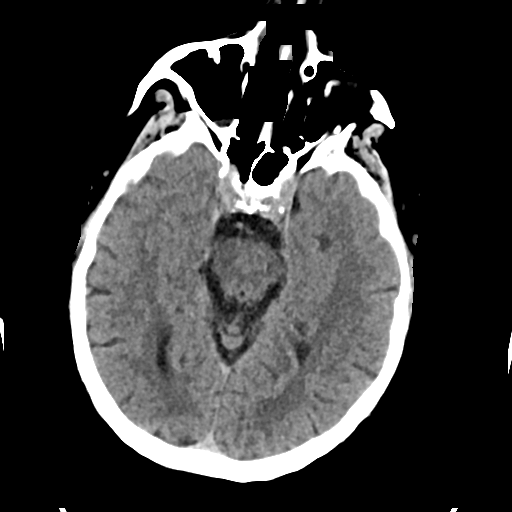
[im 18/36  brain]
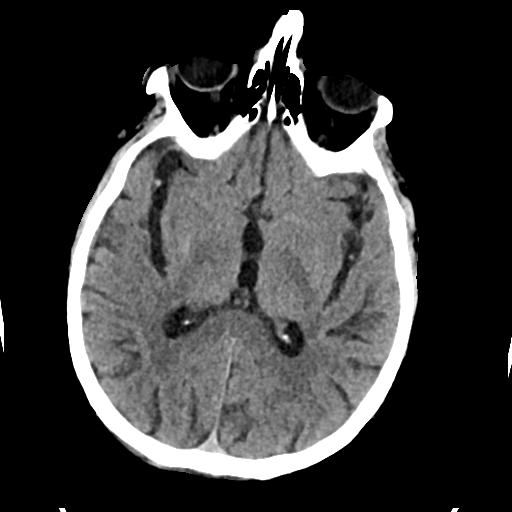
[im 22/36  brain]
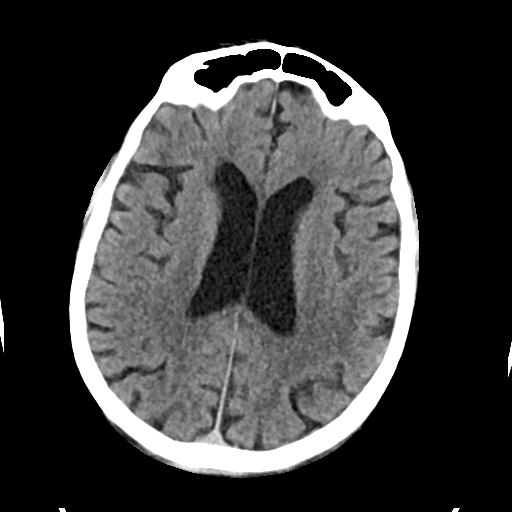
[im 22/36  bone]
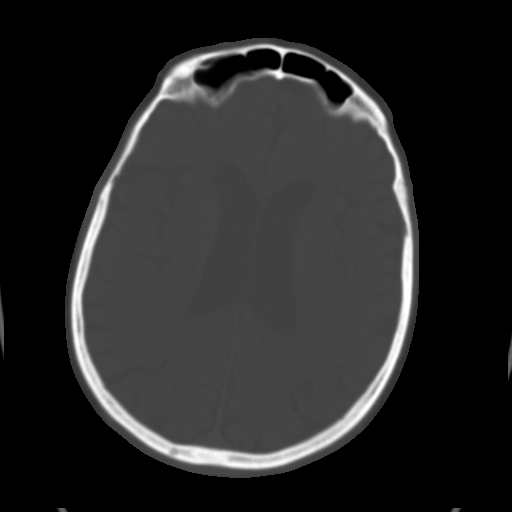
[im 27/36  brain]
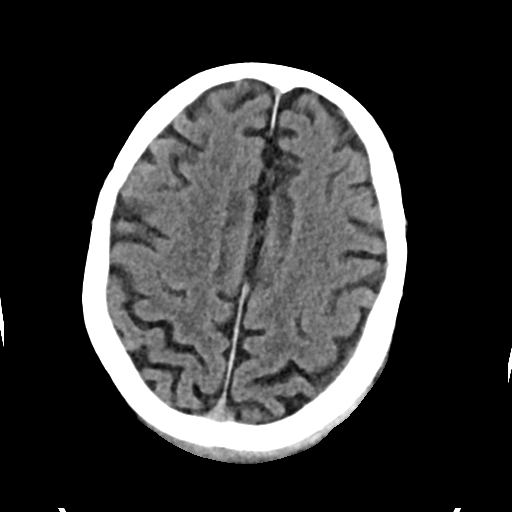
[im 31/36  brain]
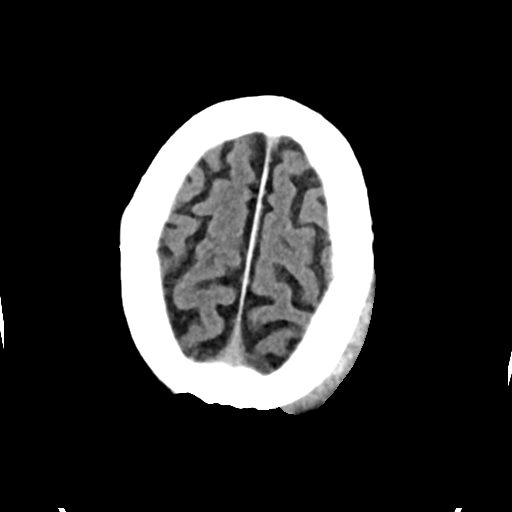

[Series 4: head bone · axial · 0.42mm/px · z∈[+1208,+1272]mm · 4 of 90 slices shown]
[im 9/90  bone]
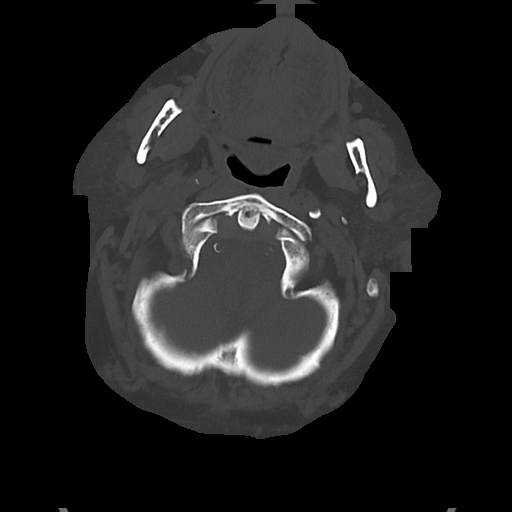
[im 18/90  bone]
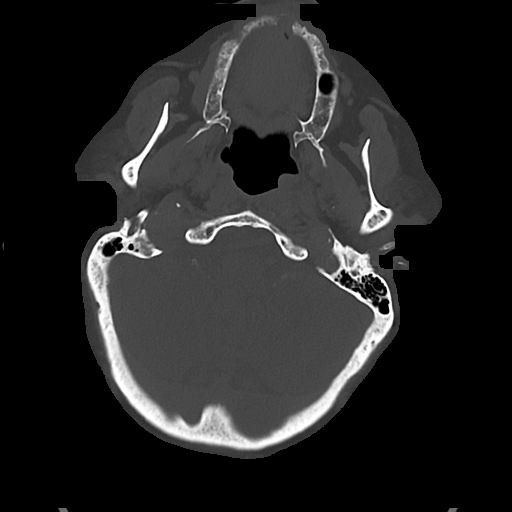
[im 27/90  bone]
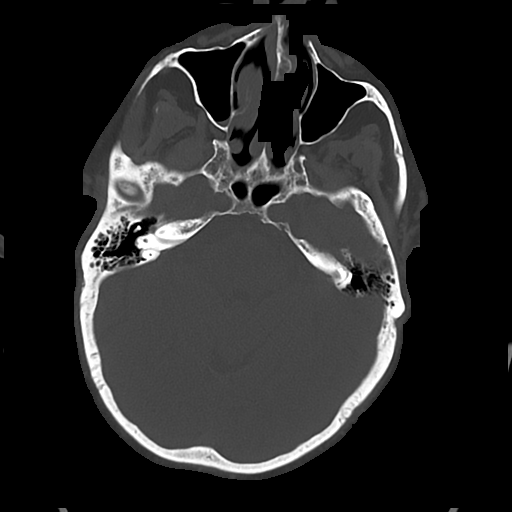
[im 41/90  bone]
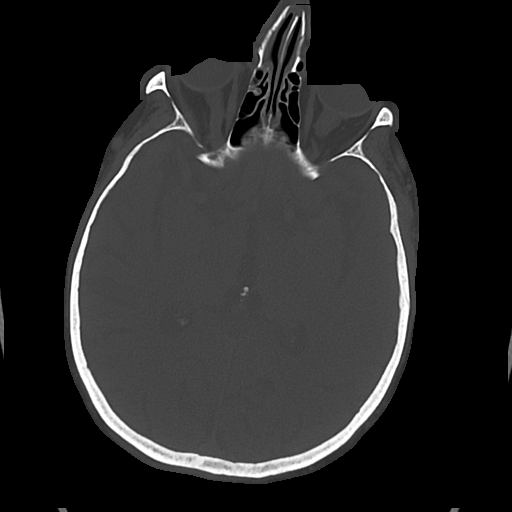

[Series 5: cor soft · coronal · 0.36mm/px · 3 of 74 slices shown]
[im 25/74  brain]
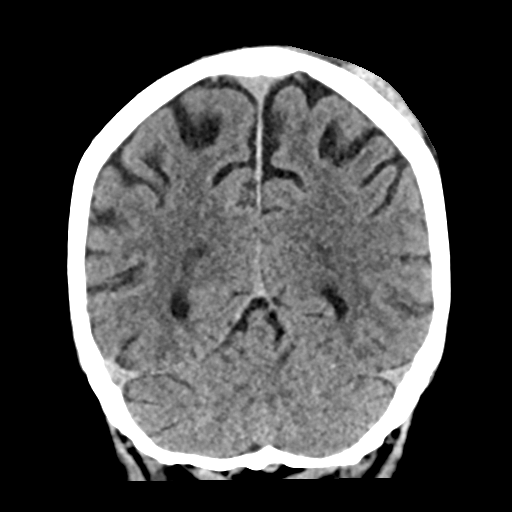
[im 33/74  brain]
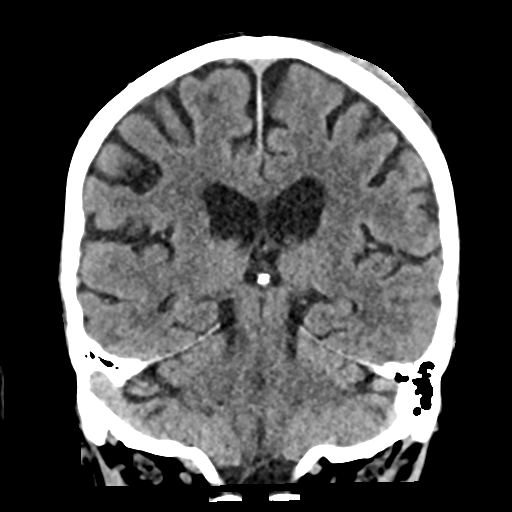
[im 41/74  brain]
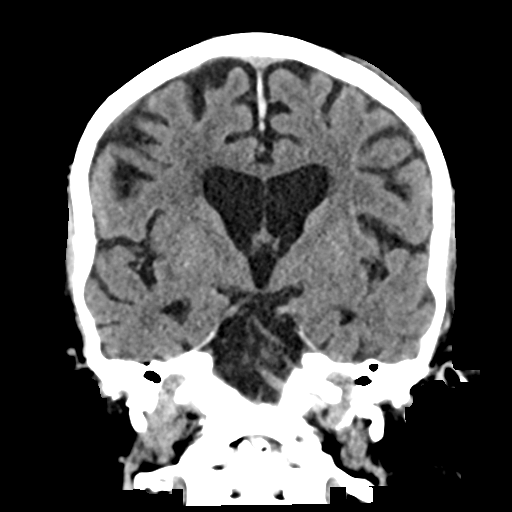

[Series 6: sag soft · sagittal · 0.36mm/px · 3 of 60 slices shown]
[im 20/60  brain]
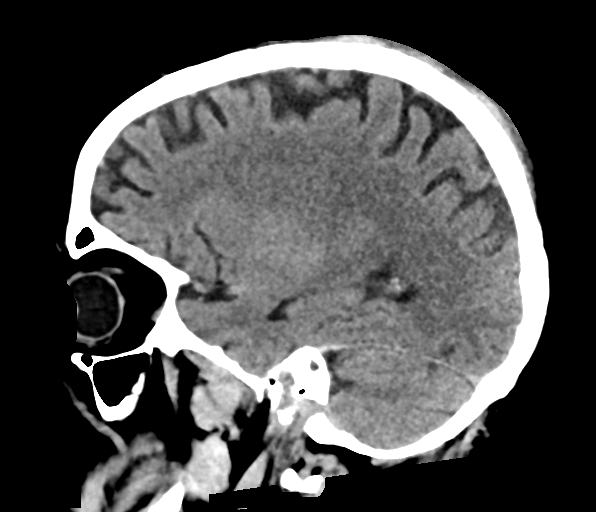
[im 30/60  brain]
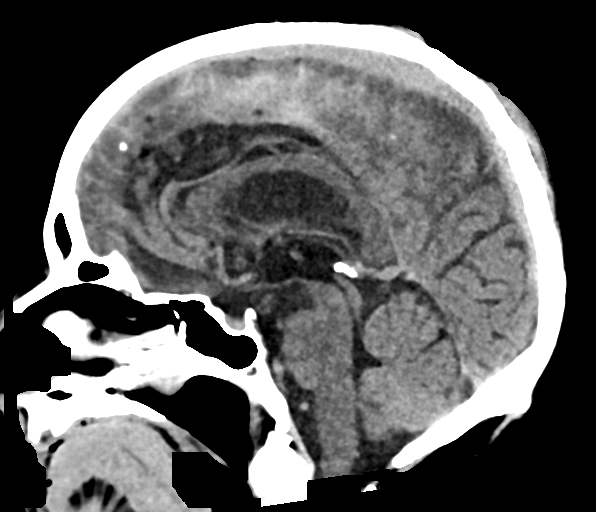
[im 40/60  brain]
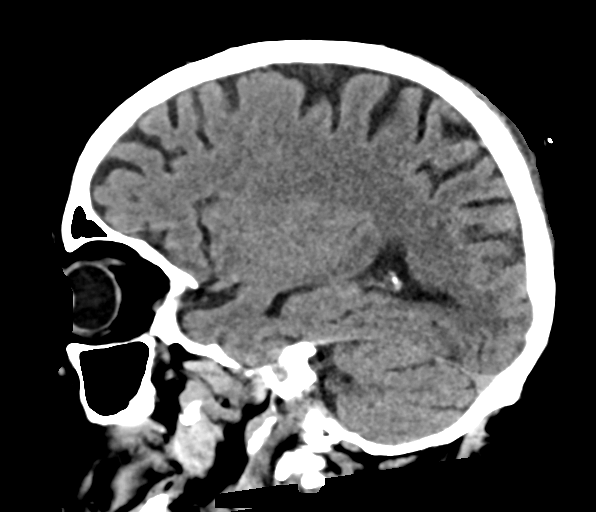

[17 of 47 positions shown; findings below may reference images not displayed]

FINDINGS: Brain: The left para falcine subdural hematoma seen on prior study
is again identified, measuring up to 3 mm in thickness, stable. No
new areas of hemorrhage. No acute infarct. Lateral ventricles and
remaining midline structures are unremarkable. No mass effect.

Vascular: No hyperdense vessel or unexpected calcification.

Skull: Large soft tissue defect overlying the occipital region of
the calvarium, stable. Superficial scalp hematoma seen within the
left parietal region. There are no underlying fractures. Remainder
of the calvarium is unremarkable.

Sinuses/Orbits: Minimal mucosal thickening right maxillary sinus.
Remaining sinuses are clear.

Other: None.
IMPRESSION: 1. Stable 3 mm left para falcine subdural hematoma.  No mass effect.
2. Large soft tissue defect in the scalp overlying the occiput and
stable left parietal scalp hematoma. No underlying fractures.
